# Patient Record
Sex: Female | Born: 1960 | Race: White | Hispanic: Yes | Marital: Married | Smoking: Former smoker
Health system: Southern US, Community
[De-identification: ages and names within clinical notes are randomized; demographics above are authoritative.]

## PROBLEM LIST (undated history)

## (undated) DIAGNOSIS — I38 Endocarditis, valve unspecified: Secondary | ICD-10-CM

## (undated) DIAGNOSIS — E039 Hypothyroidism, unspecified: Secondary | ICD-10-CM

## (undated) DIAGNOSIS — R011 Cardiac murmur, unspecified: Secondary | ICD-10-CM

## (undated) DIAGNOSIS — T8859XA Other complications of anesthesia, initial encounter: Secondary | ICD-10-CM

## (undated) DIAGNOSIS — F32A Depression, unspecified: Secondary | ICD-10-CM

## (undated) DIAGNOSIS — E119 Type 2 diabetes mellitus without complications: Secondary | ICD-10-CM

## (undated) DIAGNOSIS — R059 Cough, unspecified: Secondary | ICD-10-CM

## (undated) DIAGNOSIS — R0600 Dyspnea, unspecified: Secondary | ICD-10-CM

## (undated) DIAGNOSIS — R112 Nausea with vomiting, unspecified: Secondary | ICD-10-CM

## (undated) DIAGNOSIS — I10 Essential (primary) hypertension: Secondary | ICD-10-CM

## (undated) DIAGNOSIS — G629 Polyneuropathy, unspecified: Secondary | ICD-10-CM

## (undated) DIAGNOSIS — N289 Disorder of kidney and ureter, unspecified: Secondary | ICD-10-CM

## (undated) DIAGNOSIS — R05 Cough: Secondary | ICD-10-CM

## (undated) DIAGNOSIS — F329 Major depressive disorder, single episode, unspecified: Secondary | ICD-10-CM

## (undated) DIAGNOSIS — F419 Anxiety disorder, unspecified: Secondary | ICD-10-CM

## (undated) DIAGNOSIS — Z9889 Other specified postprocedural states: Secondary | ICD-10-CM

## (undated) DIAGNOSIS — T4145XA Adverse effect of unspecified anesthetic, initial encounter: Secondary | ICD-10-CM

## (undated) HISTORY — PX: AMPUTATION TOE: SHX6595

## (undated) HISTORY — PX: DILATION AND CURETTAGE OF UTERUS: SHX78

---

## 2016-11-21 ENCOUNTER — Ambulatory Visit
Admission: EM | Admit: 2016-11-21 | Discharge: 2016-11-21 | Disposition: A | Discharge status: Home / Self Care | Payer: Medicare Other | Attending: Emergency Medicine | Admitting: Emergency Medicine

## 2016-11-21 ENCOUNTER — Encounter: Payer: Self-pay | Admitting: *Deleted

## 2016-11-21 DIAGNOSIS — G44209 Tension-type headache, unspecified, not intractable: Secondary | ICD-10-CM

## 2016-11-21 DIAGNOSIS — Z760 Encounter for issue of repeat prescription: Secondary | ICD-10-CM

## 2016-11-21 HISTORY — DX: Type 2 diabetes mellitus without complications: E11.9

## 2016-11-21 HISTORY — DX: Anxiety disorder, unspecified: F41.9

## 2016-11-21 HISTORY — DX: Depression, unspecified: F32.A

## 2016-11-21 HISTORY — DX: Essential (primary) hypertension: I10

## 2016-11-21 HISTORY — DX: Disorder of kidney and ureter, unspecified: N28.9

## 2016-11-21 HISTORY — DX: Major depressive disorder, single episode, unspecified: F32.9

## 2016-11-21 MED ORDER — INSULIN LISPRO 100 UNIT/ML ~~LOC~~ SOLN: 12.0000 [IU] | Freq: Three times a day (TID) | SUBCUTANEOUS | 0 refills | Status: DC

## 2016-11-21 MED ORDER — CLONAZEPAM 0.5 MG PO TABS: 0.5000 mg | ORAL_TABLET | Freq: Every day | ORAL | 0 refills | Status: AC

## 2016-11-21 MED ORDER — CYCLOBENZAPRINE HCL 5 MG PO TABS: 5.0000 mg | ORAL_TABLET | Freq: Three times a day (TID) | ORAL | 0 refills | Status: DC | PRN

## 2016-11-21 NOTE — Discharge Instructions (Signed)
You may take 2 tabs of Flexeril up to 3 times a day. Start with 1 tablet, which is 5 mg 3 times a day. If that does not work, increase the dose. Take 1 g of Tylenol 3-4 times a day as needed for headache. Try some deep tissue massage. You may also try the Voltaren gel.

## 2016-11-21 NOTE — ED Triage Notes (Signed)
Patient started having headache and neck pain yesterday. Patient has an extensive medical history.

## 2016-11-21 NOTE — ED Provider Notes (Addendum)
HPI  SUBJECTIVE:  Denise Simpson is a 56 y.o. female who reports  bilateral neck and shoulder pain described as constant, soreness, tightness and now a occipital headache radiating to the front starting yesterday. She states that she is under significantly increased amount of stress. She states the headache was a gradual onset. She does describe mild nausea, no vomiting. She reports sinus pressure for the past month, nasal congestion, clear rhinorrhea and postnasal drip. No sleeping on her Name. No trauma to her neck, shoulders. No vomiting, fevers, photophobia, photophobia. No body aches, rash, neck stiffness. The dysarthria, aphasia, relate weakness, discoordination, facial droop. This is not the worst headache of her life. No ear pain, dental pain. No syncope, seizures. No eye strain. No visual changes. She has tried Tylenol with some improvement in her symptoms. Symptoms are worse with moving her neck. Her headache is not associated with bending forward or lying down. She has a past medical history of diabetes. States that her glucose has been running in the 200s. Normal is 180 for her. States that she is running low on her Humalog. Also PMH of hypertension, chronic kidney disease stage III, anxiety. States that she ran out of Klonopin 10 days ago. She has a history of migraines and sinusitis. No history of HIV. PMD: Dr. Brett Fairy in [place].. She is supposed to be establishing care with Dr. Maryjane Hurter but is awaiting records from [place].   Past Medical History:  Diagnosis Date  . Anxiety and depression   . Diabetes mellitus without complication (HCC)   . Hypertension   . Renal disorder     Past Surgical History:  Procedure Laterality Date  . AMPUTATION TOE Left     Family History  Problem Relation Age of Onset  . Cancer Mother   . Diabetes Father   . Hypertension Father   . Diabetes Sister   . Hypertension Sister   . Diabetes Brother   . Hypertension Brother      Social History  Substance Use Topics  . Smoking status: Never Smoker  . Smokeless tobacco: Never Used  . Alcohol use Yes    No current facility-administered medications for this encounter.   Current Outpatient Prescriptions:  .  aspirin EC 81 MG tablet, Take 81 mg by mouth daily., Disp: , Rfl:  .  atorvastatin (LIPITOR) 10 MG tablet, Take 10 mg by mouth daily at 6 PM., Disp: , Rfl:  .  Cholecalciferol (D3 HIGH POTENCY) 2000 units CAPS, Take by mouth daily., Disp: , Rfl:  .  escitalopram (LEXAPRO) 10 MG tablet, Take 10 mg by mouth daily., Disp: , Rfl:  .  fenofibrate 160 MG tablet, Take 160 mg by mouth daily., Disp: , Rfl:  .  Insulin Glargine (TOUJEO MAX SOLOSTAR) 300 UNIT/ML SOPN, Inject 36 Units into the skin daily., Disp: , Rfl:  .  levothyroxine (SYNTHROID, LEVOTHROID) 25 MCG tablet, Take 50 mcg by mouth daily before breakfast., Disp: , Rfl:  .  losartan (COZAAR) 25 MG tablet, Take 25 mg by mouth daily., Disp: , Rfl:  .  clonazePAM (KLONOPIN) 0.5 MG tablet, Take 1 tablet (0.5 mg total) by mouth at bedtime., Disp: 10 tablet, Rfl: 0 .  cyclobenzaprine (FLEXERIL) 5 MG tablet, Take 1 tablet (5 mg total) by mouth 3 (three) times daily as needed for muscle spasms., Disp: 30 tablet, Rfl: 0 .  insulin lispro (HUMALOG) 100 UNIT/ML injection, Inject 0.12 mLs (12 Units total) into the skin 3 (three) times daily before meals.,  Disp: 10 mL, Rfl: 0  No Known Allergies   ROS  As noted in HPI.   Physical Exam  BP (!) 147/82 (BP Location: Left Arm)   Pulse 81   Temp 97.8 F (36.6 C) (Oral)   Resp 16   Ht 5\' 3"  (1.6 m)   Wt 200 lb (90.7 kg)   LMP 10/31/2016   SpO2 100%   BMI 35.43 kg/m    BP Readings from Last 3 Encounters:  11/21/16 (!) 147/82    Constitutional: Well developed, well nourished, no acute distress Eyes: PERRL, EOMI, conjunctiva normal bilaterally. Limited funduscopic bilaterally. HENT: Normocephalic, atraumatic,mucus membranes moist, normal dentition.  TM  normal b/l. No TMJ tenderness. Normal dentition. No nasal congestion, no sinus tenderness. No temporal artery tenderness.  Neck: no cervical LN + bilateral trapezial muscle tenderness and tenderness at the insertion at the occiput.. No meningismus Respiratory: normal inspiratory effort Cardiovascular: Normal rate GI:  nondistended skin: No rash, skin intact Musculoskeletal: No edema, no tenderness, no deformities Neurologic: Alert & oriented x 3, CN II-XII intact, romberg neg, finger-> nose, heel-> shin equal b/l, Romberg neg,. Unable to perform tandem gait due to toe amputation. Psychiatric: Speech and behavior appropriate   ED Course   Medications - No data to display  No orders of the defined types were placed in this encounter.  No results found for this or any previous visit (from the past 24 hour(s)). No results found.   ED Clinical Impression  Acute non intractable tension-type headache  Medication refill  ED Assessment/Plan Care everywhere records reviewed. Additional medical history obtained as noted in history of present illness   last BUN/creatinine 07/2015 was 33/1.56. GFR 37. This appears to be baseline. Labs come back to 2016.   no sudden onset. Doubt SAH, ICH or space occupying lesion. Pt without fevers/chills, Pt has no meningeal sx, no nuchal rigidity. Doubt meningitis. Pt with normal neuro exam, no evidence of CVA/TIA.  Pt BP not elevated significantly, doubt hypertensive emergency. No evidence of temporal artery tenderness, no evidence of glaucoma or other ocular pathology.   Presentation most consistent with a musculoskeletal headache. Home with Tylenol thousand milligrams 4 times a day, increasing her to Flexeril 5 mg 3 times a day. She has Voltaren gel which she may try at home. We'll refill her Humalog 12 units 3 times a day and also give her a short course of Klonopin. The Klonopin may help with anxiety and muscular tension. She will follow-up with Dr.  Maryjane Hurter.  Discussed MDM, plan for follow up, signs and sx that should prompt return to ER. Pt agrees with plan  Meds ordered this encounter  Medications  . atorvastatin (LIPITOR) 10 MG tablet    Sig: Take 10 mg by mouth daily at 6 PM.  . fenofibrate 160 MG tablet    Sig: Take 160 mg by mouth daily.  Marland Kitchen losartan (COZAAR) 25 MG tablet    Sig: Take 25 mg by mouth daily.  Marland Kitchen levothyroxine (SYNTHROID, LEVOTHROID) 25 MCG tablet    Sig: Take 50 mcg by mouth daily before breakfast.  . Cholecalciferol (D3 HIGH POTENCY) 2000 units CAPS    Sig: Take by mouth daily.  Marland Kitchen DISCONTD: clonazePAM (KLONOPIN) 0.5 MG tablet    Sig: Take 0.5 mg by mouth at bedtime.  Marland Kitchen escitalopram (LEXAPRO) 10 MG tablet    Sig: Take 10 mg by mouth daily.  Marland Kitchen DISCONTD: cyclobenzaprine (FLEXERIL) 5 MG tablet    Sig: Take 5 mg by mouth  at bedtime.  Marland Kitchen. aspirin EC 81 MG tablet    Sig: Take 81 mg by mouth daily.  . Insulin Glargine (TOUJEO MAX SOLOSTAR) 300 UNIT/ML SOPN    Sig: Inject 36 Units into the skin daily.  Marland Kitchen. DISCONTD: insulin lispro (HUMALOG) 100 UNIT/ML injection    Sig: Inject 12 Units into the skin 3 (three) times daily before meals.  . clonazePAM (KLONOPIN) 0.5 MG tablet    Sig: Take 1 tablet (0.5 mg total) by mouth at bedtime.    Dispense:  10 tablet    Refill:  0  . cyclobenzaprine (FLEXERIL) 5 MG tablet    Sig: Take 1 tablet (5 mg total) by mouth 3 (three) times daily as needed for muscle spasms.    Dispense:  30 tablet    Refill:  0  . insulin lispro (HUMALOG) 100 UNIT/ML injection    Sig: Inject 0.12 mLs (12 Units total) into the skin 3 (three) times daily before meals.    Dispense:  10 mL    Refill:  0    *This clinic note was created using Scientist, clinical (histocompatibility and immunogenetics)Dragon dictation software. Therefore, there may be occasional mistakes despite careful proofreading.  ?   Domenick GongMortenson, Filimon Miranda, MD 11/21/16 2150    Domenick GongMortenson, Tamilyn Lupien, MD 11/21/16 2150

## 2017-01-05 ENCOUNTER — Encounter: Payer: Self-pay | Admitting: *Deleted

## 2017-01-05 ENCOUNTER — Ambulatory Visit
Admission: EM | Admit: 2017-01-05 | Discharge: 2017-01-05 | Disposition: A | Discharge status: Home / Self Care | Payer: Medicare Other | Attending: Family Medicine | Admitting: Family Medicine

## 2017-01-05 DIAGNOSIS — T148XXA Other injury of unspecified body region, initial encounter: Secondary | ICD-10-CM

## 2017-01-05 DIAGNOSIS — L03031 Cellulitis of right toe: Secondary | ICD-10-CM

## 2017-01-05 DIAGNOSIS — L089 Local infection of the skin and subcutaneous tissue, unspecified: Secondary | ICD-10-CM

## 2017-01-05 MED ORDER — LEVOFLOXACIN 500 MG PO TABS: 500.0000 mg | ORAL_TABLET | Freq: Every day | ORAL | 0 refills | Status: DC

## 2017-01-05 MED ORDER — INSULIN LISPRO 100 UNIT/ML ~~LOC~~ SOLN: 12.0000 [IU] | Freq: Three times a day (TID) | SUBCUTANEOUS | 0 refills | Status: AC

## 2017-01-05 MED ORDER — MUPIROCIN 2 % EX OINT: 1.0000 "application " | TOPICAL_OINTMENT | Freq: Two times a day (BID) | CUTANEOUS | 0 refills | Status: DC

## 2017-01-05 NOTE — ED Provider Notes (Signed)
[facility]    CSN: [id number] Arrival date & time: 01/05/17  1458     History   Chief Complaint Chief Complaint  Patient presents with  . Toe Injury    HPI Denise Simpson is a 56 y.o. female.   Patient is a 1 year old white female with multiple medical problems such as type 1 diabetes hypertension renal disease anxiety and depression. She's here for several problems (1) she needs refill of her Humalog insulin she has appointment with her PCP next week and will be seeing the nurse practitioner of the practice [facility].  The main problem though is on her right toe she was removing what she thought was a corn or callus on her big right toe she states she used a finishing stone and thought that she would just be removing the corn and instead she stopped ulceration instead. This happened yesterday when she removed the corn. She states she's been using Bactroban ointment but is an old prescription that she's put that on it. Her concern is that the left toe had a ulceration that was treated sounds as if it was an eschar when the eschar was peeled back by her the toe got infected and she wanted losing her left big toe. She is terrified that she may lose her right big toe now. Would request please for an antibiotic to prevent that. She has no idea course for her A1c is since she's not seen a doctor for several months she's been here for 3 months has been waiting to try to get into see a PCP.   He does not smoke no known drug allergies his history of cancer in the family. And diabetes in the family.   The history is provided by the patient. No language interpreter was used.    Past Medical History:  Diagnosis Date  . Anxiety and depression   . Diabetes mellitus without complication (HCC)   . Hypertension   . Renal disorder     There are no active problems to display for this patient.   Past Surgical History:  Procedure Laterality Date  .  AMPUTATION TOE Left     OB History    No data available       Home Medications    Prior to Admission medications   Medication Sig Start Date End Date Taking? Authorizing Provider  aspirin EC 81 MG tablet Take 81 mg by mouth daily.   Yes [provider]  atorvastatin (LIPITOR) 10 MG tablet Take 10 mg by mouth daily at 6 PM.   Yes [provider]  Cholecalciferol (D3 HIGH POTENCY) 2000 units CAPS Take by mouth daily.   Yes [provider]  cyclobenzaprine (FLEXERIL) 5 MG tablet Take 1 tablet (5 mg total) by mouth 3 (three) times daily as needed for muscle spasms. 11/21/16  Yes Domenick Gong, MD  escitalopram (LEXAPRO) 10 MG tablet Take 10 mg by mouth daily.   Yes [provider]  fenofibrate 160 MG tablet Take 160 mg by mouth daily.   Yes [provider]  Insulin Glargine (TOUJEO MAX SOLOSTAR) 300 UNIT/ML SOPN Inject 36 Units into the skin daily.   Yes [provider]  levothyroxine (SYNTHROID, LEVOTHROID) 25 MCG tablet Take 50 mcg by mouth daily before breakfast.   Yes [provider]  losartan (COZAAR) 25 MG tablet Take 25 mg by mouth daily.   Yes [provider]  clonazePAM (KLONOPIN) 0.5 MG  tablet Take 1 tablet (0.5 mg total) by mouth at bedtime. 11/21/16   Domenick Gong, MD  insulin lispro (HUMALOG) 100 UNIT/ML injection Inject 0.12 mLs (12 Units total) into the skin 3 (three) times daily before meals. 01/05/17   Hassan Rowan, MD  levofloxacin (LEVAQUIN) 500 MG tablet Take 1 tablet (500 mg total) by mouth daily. 01/05/17   Hassan Rowan, MD  mupirocin ointment (BACTROBAN) 2 % Apply 1 application topically 2 (two) times daily. 01/05/17   Hassan Rowan, MD    Family History Family History  Problem Relation Age of Onset  . Cancer Mother   . Diabetes Father   . Hypertension Father   . Diabetes Sister   . Hypertension Sister   . Diabetes Brother   . Hypertension Brother     Social History Social History    Substance Use Topics  . Smoking status: Never Smoker  . Smokeless tobacco: Never Used  . Alcohol use Yes     Allergies   Patient has no known allergies.   Review of Systems Review of Systems  All other systems reviewed and are negative.    Physical Exam Triage Vital Signs ED Triage Vitals  Enc Vitals Group     BP 01/05/17 1539 139/81     Pulse Rate 01/05/17 1539 96     Resp 01/05/17 1539 16     Temp 01/05/17 1539 98.7 F (37.1 C)     Temp Source 01/05/17 1539 Oral     SpO2 01/05/17 1539 99 %     Weight --      Height --      Head Circumference --      Peak Flow --      Pain Score 01/05/17 1542 0     Pain Loc --      Pain Edu? --      Excl. in GC? --    No data found.   Updated Vital Signs BP 139/81 (BP Location: Left Arm)   Pulse 96   Temp 98.7 F (37.1 C) (Oral)   Resp 16   LMP 12/25/2016   SpO2 99%   Visual Acuity Right Eye Distance:   Left Eye Distance:   Bilateral Distance:    Right Eye Near:   Left Eye Near:    Bilateral Near:     Physical Exam  Constitutional: She is oriented to person, place, and time. She appears well-developed and well-nourished.  Non-toxic appearance. She does not have a sickly appearance. She does not appear ill. No distress.  Obese white female  HENT:  Head: Normocephalic and atraumatic.  Eyes: Pupils are equal, round, and reactive to light.  Neck: Normal range of motion.  Pulmonary/Chest: Effort normal.  Musculoskeletal: She exhibits tenderness and deformity.       Right foot: There is tenderness, swelling and laceration.       Feet:  Small ulceration on the right PICC toe on the lateral side it does not appear to be markedly infected or red but there is a slight redness to the center of it she's has the left big toe missing  Neurological: She is alert and oriented to person, place, and time.  Skin: No rash noted. No erythema.  Psychiatric: She has a normal mood and affect.  Vitals reviewed.    UC Treatments  / Results  Labs (all labs ordered are listed, but only abnormal results are displayed) Labs Reviewed - No data to display  EKG  EKG Interpretation None  Radiology No results found.  Procedures Procedures (including critical care time)  Medications Ordered in UC Medications - No data to display   Initial Impression / Assessment and Plan / UC Course  I have reviewed the triage vital signs and the nursing notes.  Pertinent labs & imaging results that were available during my care of the patient were reviewed by me and considered in my medical decision making (see chart for details).     The redness could just be from the corn being removed or could be an early infection we'll take a chance this lady will place on Levaquin 500 mg 1 tablet day for 7 days continue the Bactroban to 3 times a day given new prescription for that we'll renew her Humalog one bout as requested for her follow-up with her new PCP next week.  Final Clinical Impressions(s) / UC Diagnoses   Final diagnoses:  Cellulitis of toe of right foot  Post-traumatic wound infection    New Prescriptions Discharge Medication List as of 01/05/2017  5:00 PM    START taking these medications   Details  levofloxacin (LEVAQUIN) 500 MG tablet Take 1 tablet (500 mg total) by mouth daily., Starting Mon 01/05/2017, Normal    mupirocin ointment (BACTROBAN) 2 % Apply 1 application topically 2 (two) times daily., Starting Mon 01/05/2017, Normal        Note: This dictation was prepared with Dragon dictation along with smaller phrase technology. Any transcriptional errors that result from this process are unintentional.   Hassan RowanWade, Kayhan Boardley, MD 01/05/17 1719

## 2017-01-05 NOTE — ED Triage Notes (Signed)
Patient wash removing a callus from her left big causing a dark spot. 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

## 2018-01-21 ENCOUNTER — Other Ambulatory Visit: Payer: Self-pay | Admitting: Nurse Practitioner

## 2018-01-21 DIAGNOSIS — Z1231 Encounter for screening mammogram for malignant neoplasm of breast: Secondary | ICD-10-CM

## 2018-02-10 ENCOUNTER — Encounter (INDEPENDENT_AMBULATORY_CARE_PROVIDER_SITE_OTHER): Payer: Self-pay

## 2018-02-10 ENCOUNTER — Ambulatory Visit
Admission: RE | Admit: 2018-02-10 | Discharge: 2018-02-10 | Disposition: A | Discharge status: Home / Self Care | Payer: Medicare Other | Source: Ambulatory Visit | Attending: Nurse Practitioner | Admitting: Nurse Practitioner

## 2018-02-10 DIAGNOSIS — Z1231 Encounter for screening mammogram for malignant neoplasm of breast: Secondary | ICD-10-CM

## 2018-03-03 ENCOUNTER — Inpatient Hospital Stay
Admission: RE | Admit: 2018-03-03 | Discharge: 2018-03-03 | Disposition: A | Discharge status: Home / Self Care | Payer: Self-pay | Source: Ambulatory Visit | Attending: *Deleted | Admitting: *Deleted

## 2018-03-03 ENCOUNTER — Other Ambulatory Visit: Payer: Self-pay | Admitting: *Deleted

## 2018-03-03 DIAGNOSIS — Z9289 Personal history of other medical treatment: Secondary | ICD-10-CM

## 2018-07-19 ENCOUNTER — Encounter: Payer: Self-pay | Admitting: *Deleted

## 2018-07-28 ENCOUNTER — Ambulatory Visit: Payer: Medicare Other | Admitting: Anesthesiology

## 2018-07-28 ENCOUNTER — Ambulatory Visit
Admission: RE | Admit: 2018-07-28 | Discharge: 2018-07-28 | Disposition: A | Discharge status: Home / Self Care | Payer: Medicare Other | Attending: Ophthalmology | Admitting: Ophthalmology

## 2018-07-28 ENCOUNTER — Encounter
Admission: RE | Disposition: A | Discharge status: Home / Self Care | Payer: Self-pay | Source: Home / Self Care | Attending: Ophthalmology

## 2018-07-28 ENCOUNTER — Other Ambulatory Visit: Payer: Self-pay

## 2018-07-28 DIAGNOSIS — Z6832 Body mass index (BMI) 32.0-32.9, adult: Secondary | ICD-10-CM | POA: Diagnosis not present

## 2018-07-28 DIAGNOSIS — H2511 Age-related nuclear cataract, right eye: Secondary | ICD-10-CM | POA: Diagnosis present

## 2018-07-28 DIAGNOSIS — I10 Essential (primary) hypertension: Secondary | ICD-10-CM | POA: Insufficient documentation

## 2018-07-28 DIAGNOSIS — E669 Obesity, unspecified: Secondary | ICD-10-CM | POA: Diagnosis not present

## 2018-07-28 DIAGNOSIS — Z89429 Acquired absence of other toe(s), unspecified side: Secondary | ICD-10-CM | POA: Diagnosis not present

## 2018-07-28 DIAGNOSIS — F419 Anxiety disorder, unspecified: Secondary | ICD-10-CM | POA: Insufficient documentation

## 2018-07-28 DIAGNOSIS — E1136 Type 2 diabetes mellitus with diabetic cataract: Secondary | ICD-10-CM | POA: Diagnosis not present

## 2018-07-28 DIAGNOSIS — R011 Cardiac murmur, unspecified: Secondary | ICD-10-CM | POA: Diagnosis not present

## 2018-07-28 DIAGNOSIS — E7800 Pure hypercholesterolemia, unspecified: Secondary | ICD-10-CM | POA: Diagnosis not present

## 2018-07-28 DIAGNOSIS — Z91041 Radiographic dye allergy status: Secondary | ICD-10-CM | POA: Insufficient documentation

## 2018-07-28 DIAGNOSIS — E079 Disorder of thyroid, unspecified: Secondary | ICD-10-CM | POA: Diagnosis not present

## 2018-07-28 DIAGNOSIS — R05 Cough: Secondary | ICD-10-CM | POA: Diagnosis not present

## 2018-07-28 DIAGNOSIS — F329 Major depressive disorder, single episode, unspecified: Secondary | ICD-10-CM | POA: Diagnosis not present

## 2018-07-28 DIAGNOSIS — N289 Disorder of kidney and ureter, unspecified: Secondary | ICD-10-CM | POA: Insufficient documentation

## 2018-07-28 HISTORY — DX: Dyspnea, unspecified: R06.00

## 2018-07-28 HISTORY — DX: Other specified postprocedural states: R11.2

## 2018-07-28 HISTORY — DX: Hypothyroidism, unspecified: E03.9

## 2018-07-28 HISTORY — DX: Other complications of anesthesia, initial encounter: T88.59XA

## 2018-07-28 HISTORY — DX: Cardiac murmur, unspecified: R01.1

## 2018-07-28 HISTORY — PX: CATARACT EXTRACTION W/PHACO: SHX586

## 2018-07-28 HISTORY — DX: Nausea with vomiting, unspecified: R11.2

## 2018-07-28 HISTORY — DX: Cough, unspecified: R05.9

## 2018-07-28 HISTORY — DX: Other specified postprocedural states: Z98.890

## 2018-07-28 HISTORY — DX: Cough: R05

## 2018-07-28 HISTORY — DX: Adverse effect of unspecified anesthetic, initial encounter: T41.45XA

## 2018-07-28 LAB — GLUCOSE, CAPILLARY: Glucose-Capillary: 79 mg/dL (ref 70–99)

## 2018-07-28 SURGERY — PHACOEMULSIFICATION, CATARACT, WITH IOL INSERTION
Anesthesia: Monitor Anesthesia Care | Site: Eye | Laterality: Right

## 2018-07-28 MED ORDER — MIDAZOLAM HCL 2 MG/2ML IJ SOLN
INTRAMUSCULAR | Status: AC
  Filled 2018-07-28: qty 2

## 2018-07-28 MED ORDER — SODIUM HYALURONATE 23 MG/ML IO SOLN
INTRAOCULAR | Status: DC | PRN
  Administered 2018-07-28: 0.6 mL via INTRAOCULAR

## 2018-07-28 MED ORDER — POVIDONE-IODINE 5 % OP SOLN
OPHTHALMIC | Status: DC | PRN
  Administered 2018-07-28: 1 via OPHTHALMIC

## 2018-07-28 MED ORDER — SODIUM HYALURONATE 10 MG/ML IO SOLN
INTRAOCULAR | Status: DC | PRN
  Administered 2018-07-28: 0.55 mL via INTRAOCULAR

## 2018-07-28 MED ORDER — DEXAMETHASONE SODIUM PHOSPHATE 10 MG/ML IJ SOLN
INTRAMUSCULAR | Status: DC | PRN
  Administered 2018-07-28: 8 mg via INTRAVENOUS

## 2018-07-28 MED ORDER — MOXIFLOXACIN HCL 0.5 % OP SOLN
OPHTHALMIC | Status: DC | PRN
  Administered 2018-07-28: 0.2 mL via OPHTHALMIC

## 2018-07-28 MED ORDER — EPINEPHRINE PF 1 MG/ML IJ SOLN
INTRAOCULAR | Status: DC | PRN
  Administered 2018-07-28: 200 mL via OPHTHALMIC

## 2018-07-28 MED ORDER — LIDOCAINE HCL (PF) 4 % IJ SOLN
INTRAOCULAR | Status: DC | PRN
  Administered 2018-07-28: 4 mL via OPHTHALMIC

## 2018-07-28 MED ORDER — FENTANYL CITRATE (PF) 100 MCG/2ML IJ SOLN
INTRAMUSCULAR | Status: AC
  Filled 2018-07-28: qty 2

## 2018-07-28 MED ORDER — MIDAZOLAM HCL 2 MG/2ML IJ SOLN
INTRAMUSCULAR | Status: DC | PRN
  Administered 2018-07-28: 1 mg via INTRAVENOUS

## 2018-07-28 MED ORDER — SODIUM CHLORIDE 0.9 % IV SOLN
INTRAVENOUS | Status: DC
  Administered 2018-07-28 (×2): via INTRAVENOUS

## 2018-07-28 MED ORDER — FENTANYL CITRATE (PF) 100 MCG/2ML IJ SOLN
INTRAMUSCULAR | Status: DC | PRN
  Administered 2018-07-28: 50 ug via INTRAVENOUS

## 2018-07-28 MED ORDER — TETRACAINE HCL 0.5 % OP SOLN
1.0000 [drp] | OPHTHALMIC | Status: AC | PRN
  Administered 2018-07-28 (×3): 1 [drp] via OPHTHALMIC

## 2018-07-28 MED ORDER — ARMC OPHTHALMIC DILATING DROPS
1.0000 "application " | OPHTHALMIC | Status: AC
  Administered 2018-07-28 (×3): 1 via OPHTHALMIC

## 2018-07-28 MED ORDER — MOXIFLOXACIN HCL 0.5 % OP SOLN: 1.0000 [drp] | OPHTHALMIC | Status: DC | PRN

## 2018-07-28 SURGICAL SUPPLY — 16 items
DISSECTOR HYDRO NUCLEUS 50X22 (MISCELLANEOUS) ×2 IMPLANT
GLOVE BIO SURGEON STRL SZ8 (GLOVE) ×2 IMPLANT
GLOVE BIOGEL M 6.5 STRL (GLOVE) ×2 IMPLANT
GLOVE SURG LX 7.5 STRW (GLOVE) ×1
GLOVE SURG LX STRL 7.5 STRW (GLOVE) ×1 IMPLANT
GOWN STRL REUS W/ TWL LRG LVL3 (GOWN DISPOSABLE) ×2 IMPLANT
GOWN STRL REUS W/TWL LRG LVL3 (GOWN DISPOSABLE) ×2
LABEL CATARACT MEDS ST (LABEL) ×2 IMPLANT
LENS IOL TECNIS ITEC 18.5 (Intraocular Lens) ×2 IMPLANT
PACK CATARACT (MISCELLANEOUS) ×2 IMPLANT
PACK CATARACT KING (MISCELLANEOUS) ×2 IMPLANT
PACK EYE AFTER SURG (MISCELLANEOUS) ×2 IMPLANT
SOL BSS BAG (MISCELLANEOUS) ×2
SOLUTION BSS BAG (MISCELLANEOUS) ×1 IMPLANT
WATER STERILE IRR 250ML POUR (IV SOLUTION) ×2 IMPLANT
WIPE NON LINTING 3.25X3.25 (MISCELLANEOUS) ×2 IMPLANT

## 2018-07-28 NOTE — Anesthesia Preprocedure Evaluation (Signed)
Anesthesia Evaluation  Patient identified by MRN, date of birth, ID band Patient awake    Reviewed: Allergy & Precautions, NPO status , Patient's Chart, lab work & pertinent test results  History of Anesthesia Complications (+) PONV and history of anesthetic complications  Airway Mallampati: II  TM Distance: >3 FB Neck ROM: Full    Dental  (+) Upper Dentures, Lower Dentures   Pulmonary neg sleep apnea, neg COPD,    breath sounds clear to auscultation- rhonchi (-) wheezing      Cardiovascular hypertension, Pt. on medications (-) CAD, (-) Past MI, (-) Cardiac Stents and (-) CABG  Rhythm:Regular Rate:Normal - Systolic murmurs and - Diastolic murmurs    Neuro/Psych neg Seizures PSYCHIATRIC DISORDERS Anxiety Depression negative neurological ROS     GI/Hepatic negative GI ROS, Neg liver ROS,   Endo/Other  diabetes, Insulin DependentHypothyroidism   Renal/GU Renal InsufficiencyRenal disease     Musculoskeletal negative musculoskeletal ROS (+)   Abdominal (+) + obese,   Peds  Hematology negative hematology ROS (+)   Anesthesia Other Findings Past Medical History: No date: Anxiety and depression No date: Complication of anesthesia No date: Cough     Comment:  CHRONIC AT NIGHT FROM PND No date: Diabetes mellitus without complication (HCC) No date: Dyspnea No date: Heart murmur No date: Hypertension No date: Hypothyroidism No date: PONV (postoperative nausea and vomiting) No date: Renal disorder   Reproductive/Obstetrics                             Anesthesia Physical Anesthesia Plan  ASA: III  Anesthesia Plan: MAC   Post-op Pain Management:    Induction: Intravenous  PONV Risk Score and Plan: 3 and Midazolam  Airway Management Planned: Natural Airway  Additional Equipment:   Intra-op Plan:   Post-operative Plan:   Informed Consent: I have reviewed the patients History and  Physical, chart, labs and discussed the procedure including the risks, benefits and alternatives for the proposed anesthesia with the patient or authorized representative who has indicated his/her understanding and acceptance.       Plan Discussed with: CRNA and Anesthesiologist  Anesthesia Plan Comments:         Anesthesia Quick Evaluation

## 2018-07-28 NOTE — Transfer of Care (Signed)
Immediate Anesthesia Transfer of Care Note  Patient: Denise Simpson  Procedure(s) Performed: CATARACT EXTRACTION PHACO AND INTRAOCULAR LENS PLACEMENT (IOC) RIGHT, DIABETIC (Right Eye)  Patient Location: PACU  Anesthesia Type:MAC  Level of Consciousness: awake, alert  and oriented  Airway & Oxygen Therapy: Patient Spontanous Breathing and Patient connected to nasal cannula oxygen  Post-op Assessment: Report given to RN and Post -op Vital signs reviewed and stable  Post vital signs: Reviewed and stable  Last Vitals:  Vitals Value Taken Time  BP    Temp    Pulse    Resp    SpO2      Last Pain:  Vitals:   07/28/18 1147  TempSrc: Temporal  PainSc: 3          Complications: No apparent anesthesia complications

## 2018-07-28 NOTE — H&P (Signed)

## 2018-07-28 NOTE — Op Note (Signed)
OPERATIVE NOTE  Denise Simpson [id number] 07/28/2018   PREOPERATIVE DIAGNOSIS:  Nuclear sclerotic cataract right eye.  H25.11   POSTOPERATIVE DIAGNOSIS:    Nuclear sclerotic cataract right eye.     PROCEDURE:  Phacoemusification with posterior chamber intraocular lens placement of the right eye   LENS:   Implant Name Type Inv. Item Serial No. Manufacturer Lot No. LRB No. Used  LENS IOL DIOP 18.5 - [id number] Intraocular Lens LENS IOL DIOP 18.5 [id number] AMO  Right 1       PCB00 +18.5   ULTRASOUND TIME: 0 minutes 25 seconds.  CDE 1.79   SURGEON:  Willey Blade, MD, MPH  ANESTHESIOLOGIST: Anesthesiologist: Alver Fisher, MD CRNA: Henrietta Hoover, CRNA   ANESTHESIA:  Topical with tetracaine drops augmented with 1% preservative-free intracameral lidocaine.  ESTIMATED BLOOD LOSS: less than 1 mL.   COMPLICATIONS:  None.   DESCRIPTION OF PROCEDURE:  The patient was identified in the holding room and transported to the operating room and placed in the supine position under the operating microscope.  The right eye was identified as the operative eye and it was prepped and draped in the usual sterile ophthalmic fashion.   A 1.0 millimeter clear-corneal paracentesis was made at the 10:30 position. 0.5 ml of preservative-free 1% lidocaine with epinephrine was injected into the anterior chamber.  The anterior chamber was filled with Healon 5 viscoelastic.  A 2.4 millimeter keratome was used to make a near-clear corneal incision at the 8:00 position.  A curvilinear capsulorrhexis was made with a cystotome and capsulorrhexis forceps.  Balanced salt solution was used to hydrodissect and hydrodelineate the nucleus.   Phacoemulsification was then used in stop and chop fashion to remove the lens nucleus and epinucleus.  The remaining cortex was then removed using the irrigation and aspiration handpiece. Healon was then placed into the capsular bag to distend it for lens placement.  A lens was  then injected into the capsular bag.  The remaining viscoelastic was aspirated.   Wounds were hydrated with balanced salt solution.  The anterior chamber was inflated to a physiologic pressure with balanced salt solution.   Intracameral vigamox 0.1 mL undiluted was injected into the eye and a drop placed onto the ocular surface.  No wound leaks were noted.  The patient was taken to the recovery room in stable condition without complications of anesthesia or surgery  Willey Blade 07/28/2018, 12:03 PM

## 2018-07-28 NOTE — Anesthesia Post-op Follow-up Note (Signed)
Anesthesia QCDR form completed.        

## 2018-07-28 NOTE — Discharge Instructions (Signed)

## 2018-07-28 NOTE — Anesthesia Postprocedure Evaluation (Signed)
Anesthesia Post Note  Patient: Radiographer, therapeutic  Procedure(s) Performed: CATARACT EXTRACTION PHACO AND INTRAOCULAR LENS PLACEMENT (IOC) RIGHT, DIABETIC (Right Eye)  Patient location during evaluation: Short Stay Anesthesia Type: MAC Level of consciousness: awake and awake and alert Pain management: pain level controlled Vital Signs Assessment: post-procedure vital signs reviewed and stable Respiratory status: spontaneous breathing Cardiovascular status: blood pressure returned to baseline Postop Assessment: no headache and no backache Anesthetic complications: no     Last Vitals:  Vitals:   07/28/18 1147  BP: (!) 159/78  Pulse: 70  Resp: 18  Temp: (!) 36.2 C  SpO2: 100%    Last Pain:  Vitals:   07/28/18 1147  TempSrc: Temporal  PainSc: 3                  Buckner Malta

## 2018-08-23 ENCOUNTER — Encounter: Payer: Self-pay | Admitting: *Deleted

## 2018-08-25 ENCOUNTER — Ambulatory Visit
Admission: RE | Admit: 2018-08-25 | Discharge: 2018-08-25 | Disposition: A | Discharge status: Home / Self Care | Payer: Medicare Other | Attending: Ophthalmology | Admitting: Ophthalmology

## 2018-08-25 ENCOUNTER — Ambulatory Visit: Payer: Medicare Other | Admitting: Anesthesiology

## 2018-08-25 ENCOUNTER — Encounter: Payer: Self-pay | Admitting: Certified Registered Nurse Anesthetist

## 2018-08-25 ENCOUNTER — Encounter
Admission: RE | Disposition: A | Discharge status: Home / Self Care | Payer: Self-pay | Source: Home / Self Care | Attending: Ophthalmology

## 2018-08-25 ENCOUNTER — Other Ambulatory Visit: Payer: Self-pay

## 2018-08-25 DIAGNOSIS — N183 Chronic kidney disease, stage 3 (moderate): Secondary | ICD-10-CM | POA: Insufficient documentation

## 2018-08-25 DIAGNOSIS — E1136 Type 2 diabetes mellitus with diabetic cataract: Secondary | ICD-10-CM | POA: Diagnosis not present

## 2018-08-25 DIAGNOSIS — Z79890 Hormone replacement therapy (postmenopausal): Secondary | ICD-10-CM | POA: Insufficient documentation

## 2018-08-25 DIAGNOSIS — E1122 Type 2 diabetes mellitus with diabetic chronic kidney disease: Secondary | ICD-10-CM | POA: Insufficient documentation

## 2018-08-25 DIAGNOSIS — Z79899 Other long term (current) drug therapy: Secondary | ICD-10-CM | POA: Insufficient documentation

## 2018-08-25 DIAGNOSIS — Z794 Long term (current) use of insulin: Secondary | ICD-10-CM | POA: Insufficient documentation

## 2018-08-25 DIAGNOSIS — E039 Hypothyroidism, unspecified: Secondary | ICD-10-CM | POA: Insufficient documentation

## 2018-08-25 DIAGNOSIS — I129 Hypertensive chronic kidney disease with stage 1 through stage 4 chronic kidney disease, or unspecified chronic kidney disease: Secondary | ICD-10-CM | POA: Insufficient documentation

## 2018-08-25 DIAGNOSIS — H2512 Age-related nuclear cataract, left eye: Secondary | ICD-10-CM | POA: Insufficient documentation

## 2018-08-25 DIAGNOSIS — F418 Other specified anxiety disorders: Secondary | ICD-10-CM | POA: Insufficient documentation

## 2018-08-25 DIAGNOSIS — Z87891 Personal history of nicotine dependence: Secondary | ICD-10-CM | POA: Diagnosis not present

## 2018-08-25 DIAGNOSIS — I340 Nonrheumatic mitral (valve) insufficiency: Secondary | ICD-10-CM | POA: Insufficient documentation

## 2018-08-25 HISTORY — DX: Endocarditis, valve unspecified: I38

## 2018-08-25 HISTORY — PX: CATARACT EXTRACTION W/PHACO: SHX586

## 2018-08-25 HISTORY — DX: Polyneuropathy, unspecified: G62.9

## 2018-08-25 LAB — GLUCOSE, CAPILLARY: Glucose-Capillary: 111 mg/dL — ABNORMAL HIGH (ref 70–99)

## 2018-08-25 SURGERY — PHACOEMULSIFICATION, CATARACT, WITH IOL INSERTION
Anesthesia: Monitor Anesthesia Care | Site: Eye | Laterality: Left

## 2018-08-25 MED ORDER — CARBACHOL 0.01 % IO SOLN
INTRAOCULAR | Status: DC | PRN
  Administered 2018-08-25: 0.5 mL via INTRAOCULAR

## 2018-08-25 MED ORDER — FENTANYL CITRATE (PF) 100 MCG/2ML IJ SOLN
INTRAMUSCULAR | Status: DC | PRN
  Administered 2018-08-25: 50 ug via INTRAVENOUS

## 2018-08-25 MED ORDER — MOXIFLOXACIN HCL 0.5 % OP SOLN: 1.0000 [drp] | OPHTHALMIC | Status: DC | PRN

## 2018-08-25 MED ORDER — ARMC OPHTHALMIC DILATING DROPS
1.0000 "application " | OPHTHALMIC | Status: AC
  Administered 2018-08-25 (×3): 1 via OPHTHALMIC

## 2018-08-25 MED ORDER — MOXIFLOXACIN HCL 0.5 % OP SOLN
OPHTHALMIC | Status: AC
  Filled 2018-08-25: qty 3

## 2018-08-25 MED ORDER — SODIUM HYALURONATE 10 MG/ML IO SOLN
INTRAOCULAR | Status: DC | PRN
  Administered 2018-08-25: 0.55 mL via INTRAOCULAR

## 2018-08-25 MED ORDER — EPINEPHRINE PF 1 MG/ML IJ SOLN
INTRAOCULAR | Status: DC | PRN
  Administered 2018-08-25: 12:00:00 via OPHTHALMIC

## 2018-08-25 MED ORDER — TETRACAINE HCL 0.5 % OP SOLN
1.0000 [drp] | OPHTHALMIC | Status: DC | PRN
  Administered 2018-08-25: 1 [drp] via OPHTHALMIC

## 2018-08-25 MED ORDER — FENTANYL CITRATE (PF) 100 MCG/2ML IJ SOLN
INTRAMUSCULAR | Status: AC
  Filled 2018-08-25: qty 2

## 2018-08-25 MED ORDER — MOXIFLOXACIN HCL 0.5 % OP SOLN
OPHTHALMIC | Status: DC | PRN
  Administered 2018-08-25: 0.2 mL via OPHTHALMIC

## 2018-08-25 MED ORDER — LIDOCAINE HCL (PF) 4 % IJ SOLN
INTRAOCULAR | Status: DC | PRN
  Administered 2018-08-25: 4 mL via OPHTHALMIC

## 2018-08-25 MED ORDER — EPINEPHRINE PF 1 MG/ML IJ SOLN
INTRAMUSCULAR | Status: AC
  Filled 2018-08-25: qty 2

## 2018-08-25 MED ORDER — POVIDONE-IODINE 5 % OP SOLN
OPHTHALMIC | Status: DC | PRN
  Administered 2018-08-25: 1 via OPHTHALMIC

## 2018-08-25 MED ORDER — POVIDONE-IODINE 5 % OP SOLN
OPHTHALMIC | Status: AC
  Filled 2018-08-25: qty 30

## 2018-08-25 MED ORDER — MIDAZOLAM HCL 2 MG/2ML IJ SOLN
INTRAMUSCULAR | Status: DC | PRN
  Administered 2018-08-25 (×2): 0.5 mg via INTRAVENOUS
  Administered 2018-08-25: 1 mg via INTRAVENOUS

## 2018-08-25 MED ORDER — TETRACAINE HCL 0.5 % OP SOLN
OPHTHALMIC | Status: AC
  Administered 2018-08-25: 1 [drp] via OPHTHALMIC
  Filled 2018-08-25: qty 4

## 2018-08-25 MED ORDER — SODIUM HYALURONATE 23 MG/ML IO SOLN
INTRAOCULAR | Status: DC | PRN
  Administered 2018-08-25: 0.6 mL via INTRAOCULAR

## 2018-08-25 MED ORDER — MIDAZOLAM HCL 2 MG/2ML IJ SOLN
INTRAMUSCULAR | Status: AC
  Filled 2018-08-25: qty 2

## 2018-08-25 MED ORDER — ARMC OPHTHALMIC DILATING DROPS
OPHTHALMIC | Status: AC
  Administered 2018-08-25: 1 via OPHTHALMIC
  Filled 2018-08-25: qty 0.5

## 2018-08-25 MED ORDER — LIDOCAINE HCL (PF) 4 % IJ SOLN
INTRAMUSCULAR | Status: AC
  Filled 2018-08-25: qty 5

## 2018-08-25 MED ORDER — SODIUM HYALURONATE 23 MG/ML IO SOLN
INTRAOCULAR | Status: AC
  Filled 2018-08-25: qty 0.6

## 2018-08-25 MED ORDER — SODIUM CHLORIDE 0.9 % IV SOLN
INTRAVENOUS | Status: DC
  Administered 2018-08-25: 10:00:00 via INTRAVENOUS

## 2018-08-25 SURGICAL SUPPLY — 16 items

## 2018-08-25 NOTE — H&P (Signed)

## 2018-08-25 NOTE — Anesthesia Preprocedure Evaluation (Signed)
Anesthesia Evaluation  Patient identified by MRN, date of birth, ID band Patient awake    Reviewed: Allergy & Precautions, NPO status , Patient's Chart, lab work & pertinent test results  History of Anesthesia Complications (+) PONV and history of anesthetic complications  Airway Mallampati: II  TM Distance: >3 FB Neck ROM: Full    Dental  (+) Upper Dentures, Lower Dentures   Pulmonary neg sleep apnea, neg COPD, former smoker,    breath sounds clear to auscultation- rhonchi (-) wheezing      Cardiovascular hypertension, Pt. on medications (-) CAD, (-) Past MI, (-) Cardiac Stents and (-) CABG  Rhythm:Regular Rate:Normal - Systolic murmurs and - Diastolic murmurs    Neuro/Psych neg Seizures PSYCHIATRIC DISORDERS Anxiety Depression negative neurological ROS     GI/Hepatic negative GI ROS, Neg liver ROS,   Endo/Other  diabetes, Insulin DependentHypothyroidism   Renal/GU Renal InsufficiencyRenal disease     Musculoskeletal negative musculoskeletal ROS (+)   Abdominal (+) + obese,   Peds  Hematology negative hematology ROS (+)   Anesthesia Other Findings Past Medical History: No date: Anxiety and depression No date: Complication of anesthesia No date: Cough     Comment:  CHRONIC AT NIGHT FROM PND No date: Diabetes mellitus without complication (HCC) No date: Dyspnea No date: Heart murmur No date: Hypertension No date: Hypothyroidism No date: PONV (postoperative nausea and vomiting) No date: Renal disorder   Reproductive/Obstetrics                             Anesthesia Physical  Anesthesia Plan  ASA: III  Anesthesia Plan: MAC   Post-op Pain Management:    Induction: Intravenous  PONV Risk Score and Plan: 3 and Midazolam  Airway Management Planned: Natural Airway  Additional Equipment:   Intra-op Plan:   Post-operative Plan:   Informed Consent: I have reviewed the  patients History and Physical, chart, labs and discussed the procedure including the risks, benefits and alternatives for the proposed anesthesia with the patient or authorized representative who has indicated his/her understanding and acceptance.       Plan Discussed with: CRNA and Anesthesiologist  Anesthesia Plan Comments:         Anesthesia Quick Evaluation

## 2018-08-25 NOTE — Anesthesia Post-op Follow-up Note (Signed)
Anesthesia QCDR form completed.        

## 2018-08-25 NOTE — Transfer of Care (Signed)
Immediate Anesthesia Transfer of Care Note  Patient: Denise Simpson  Procedure(s) Performed: CATARACT EXTRACTION PHACO AND INTRAOCULAR LENS PLACEMENT (IOC) Left (Left Eye)  Patient Location: PACU  Anesthesia Type:MAC  Level of Consciousness: awake, alert  and oriented  Airway & Oxygen Therapy: Patient Spontanous Breathing  Post-op Assessment: Report given to RN and Post -op Vital signs reviewed and stable  Post vital signs: Reviewed and stable  Last Vitals:  Vitals Value Taken Time  BP    Temp    Pulse    Resp    SpO2      Last Pain:  Vitals:   08/25/18 1030  TempSrc: Oral  PainSc: 0-No pain         Complications: No apparent anesthesia complications

## 2018-08-25 NOTE — Anesthesia Procedure Notes (Signed)
Procedure Name: MAC Date/Time: 08/25/2018 12:24 PM Performed by: Johnna Acosta, CRNA Pre-anesthesia Checklist: Patient identified, Emergency Drugs available, Suction available, Patient being monitored and Timeout performed Patient Re-evaluated:Patient Re-evaluated prior to induction Oxygen Delivery Method: Nasal cannula Preoxygenation: Pre-oxygenation with 100% oxygen Induction Type: IV induction

## 2018-08-25 NOTE — Discharge Instructions (Signed)

## 2018-08-25 NOTE — Op Note (Signed)
OPERATIVE NOTE  Denise Simpson [id number] 08/25/2018   PREOPERATIVE DIAGNOSIS:  Nuclear sclerotic cataract left eye.  H25.12   POSTOPERATIVE DIAGNOSIS:    Nuclear sclerotic cataract left eye.     PROCEDURE:  Phacoemusification with posterior chamber intraocular lens placement of the left eye   LENS:   Implant Name Type Inv. Item Serial No. Manufacturer Lot No. LRB No. Used  LENS IOL DIOP 17.5 - [id number] Intraocular Lens LENS IOL DIOP 17.5 346 [id number] AMO  Left 1       PCB00 +17.5   ULTRASOUND TIME: 0 minutes 45 seconds.  CDE 3.14   SURGEON:  Willey Blade, MD, MPH   ANESTHESIA:  Topical with tetracaine drops augmented with 1% preservative-free intracameral lidocaine.  ESTIMATED BLOOD LOSS: <1 mL   COMPLICATIONS:  None.   DESCRIPTION OF PROCEDURE:  The patient was identified in the holding room and transported to the operating room and placed in the supine position under the operating microscope.  The left eye was identified as the operative eye and it was prepped and draped in the usual sterile ophthalmic fashion.   A 1.0 millimeter clear-corneal paracentesis was made at the 5:00 position. 0.5 ml of preservative-free 1% lidocaine with epinephrine was injected into the anterior chamber.  The anterior chamber was filled with Healon 5 viscoelastic.  A 2.4 millimeter keratome was used to make a near-clear corneal incision at the 2:00 position.  A curvilinear capsulorrhexis was made with a cystotome and capsulorrhexis forceps.  Balanced salt solution was used to hydrodissect and hydrodelineate the nucleus.   Phacoemulsification was then used in stop and chop fashion to remove the lens nucleus and epinucleus.  The remaining cortex was then removed using the irrigation and aspiration handpiece. Healon was then placed into the capsular bag to distend it for lens placement.  A lens was then injected into the capsular bag.  The remaining viscoelastic was aspirated.   Wounds were  hydrated with balanced salt solution.  The anterior chamber was inflated to a physiologic pressure with balanced salt solution.  Intracameral vigamox 0.1 mL undiltued was injected into the eye and a drop placed onto the ocular surface.  No wound leaks were noted.  The patient was taken to the recovery room in stable condition without complications of anesthesia or surgery  Willey Blade 08/25/2018, 12:41 PM

## 2018-08-25 NOTE — Anesthesia Postprocedure Evaluation (Signed)
Anesthesia Post Note  Patient: Radiographer, therapeutic  Procedure(s) Performed: CATARACT EXTRACTION PHACO AND INTRAOCULAR LENS PLACEMENT (IOC) Left (Left Eye)  Patient location during evaluation: PACU Anesthesia Type: MAC Level of consciousness: awake and alert Pain management: pain level controlled Vital Signs Assessment: post-procedure vital signs reviewed and stable Respiratory status: spontaneous breathing, nonlabored ventilation, respiratory function stable and patient connected to nasal cannula oxygen Cardiovascular status: stable and blood pressure returned to baseline Postop Assessment: no apparent nausea or vomiting Anesthetic complications: no     Last Vitals:  Vitals:   08/25/18 1243 08/25/18 1245  BP:  (!) 123/94  Pulse: 69   Resp: 18   Temp: 36.7 C   SpO2: 99%     Last Pain:  Vitals:   08/25/18 1243  TempSrc: Temporal  PainSc: 0-No pain                 Kalijah Westfall

## 2019-02-16 ENCOUNTER — Other Ambulatory Visit: Payer: Self-pay | Admitting: Nurse Practitioner

## 2019-02-16 DIAGNOSIS — Z1231 Encounter for screening mammogram for malignant neoplasm of breast: Secondary | ICD-10-CM

## 2019-03-16 ENCOUNTER — Other Ambulatory Visit: Payer: Self-pay | Admitting: Gastroenterology

## 2019-03-16 DIAGNOSIS — R1312 Dysphagia, oropharyngeal phase: Secondary | ICD-10-CM

## 2019-03-22 ENCOUNTER — Other Ambulatory Visit: Payer: Self-pay

## 2019-03-22 ENCOUNTER — Ambulatory Visit
Admission: RE | Admit: 2019-03-22 | Discharge: 2019-03-22 | Disposition: A | Discharge status: Home / Self Care | Payer: Medicare Other | Source: Ambulatory Visit | Attending: Gastroenterology | Admitting: Gastroenterology

## 2019-03-22 DIAGNOSIS — R1312 Dysphagia, oropharyngeal phase: Secondary | ICD-10-CM | POA: Diagnosis present

## 2019-11-22 ENCOUNTER — Other Ambulatory Visit: Payer: Self-pay | Admitting: Nurse Practitioner

## 2019-11-22 DIAGNOSIS — Z1231 Encounter for screening mammogram for malignant neoplasm of breast: Secondary | ICD-10-CM

## 2019-11-29 ENCOUNTER — Ambulatory Visit: Payer: Medicare Other

## 2020-09-03 IMAGING — RF DG ESOPHAGUS
12 of 20 series · 14 of 23 positions shown · non-contrast
Comparison: None.

CLINICAL DATA: Dysphagia

EXAM:
ESOPHOGRAM / BARIUM SWALLOW / BARIUM TABLET STUDY
TECHNIQUE: Combined double contrast and single contrast examination performed
using effervescent crystals, thick barium liquid, and thin barium
liquid. The patient was observed with fluoroscopy swallowing a 13 mm
barium sulphate tablet.
FLUOROSCOPY TIME:  Fluoroscopy Time:  1 minutes 30 seconds
Radiation Exposure Index (if provided by the fluoroscopic device):
34.3 mGy
Number of Acquired Spot Images: 14 full exposures

[Series 1: fluoro_barium 2fps_bw · 0.17mm/px · 1 of 1 slices shown (1 of 8)]
[im 1/1]
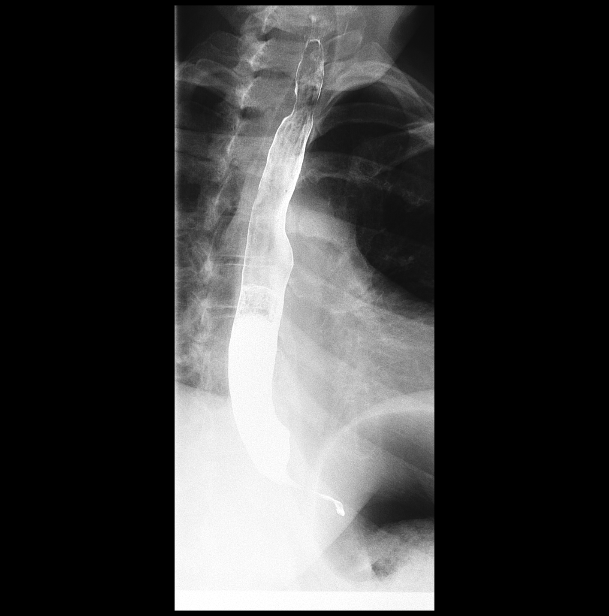

[Series 3: fluoro_barium 2fps_bw · 0.17mm/px · 1 of 1 slices shown (2 of 8)]
[im 1/1]
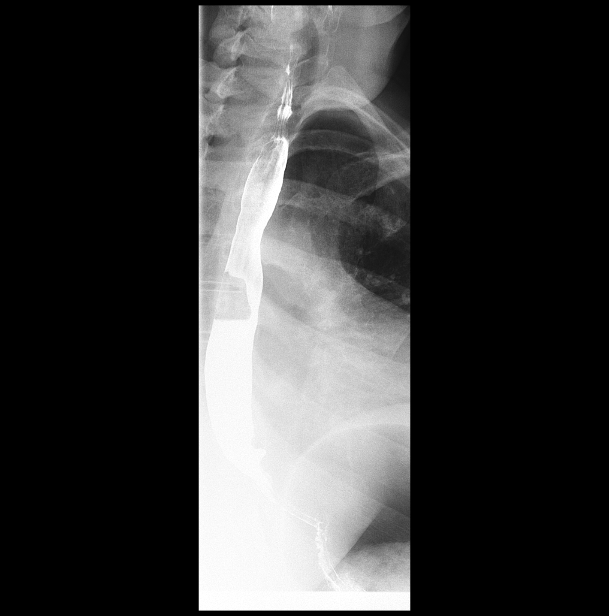

[Series 5: fluoro_barium 2fps_bw · 0.17mm/px · 1 of 1 slices shown (3 of 8)]
[im 1/1]
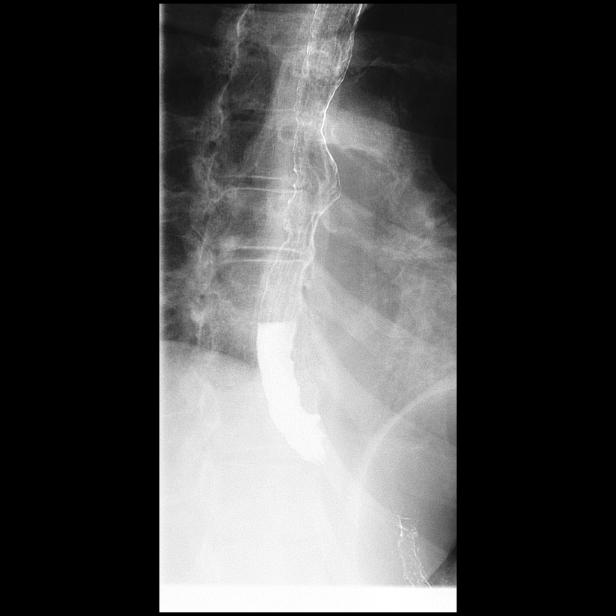

[Series 6: fluoro_barium 2fps_bw · 0.17mm/px · 1 of 1 slices shown (4 of 8)]
[im 1/1]
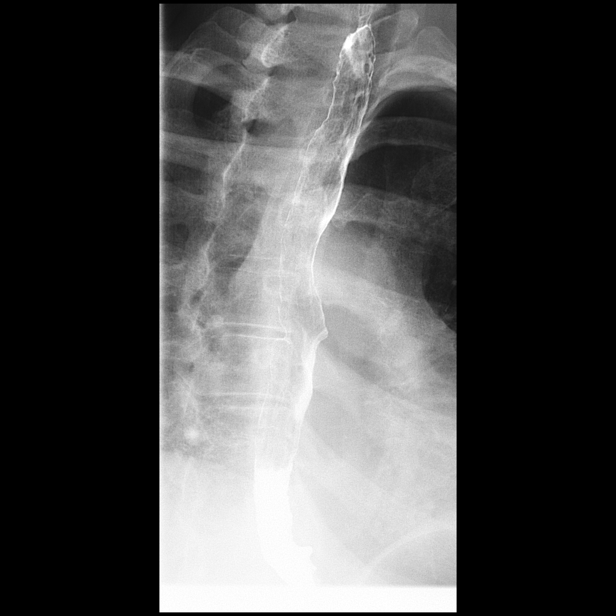

[Series 8: fluoro_barium 2fps_bw · 0.17mm/px · 1 of 1 slices shown (5 of 8)]
[im 1/1]
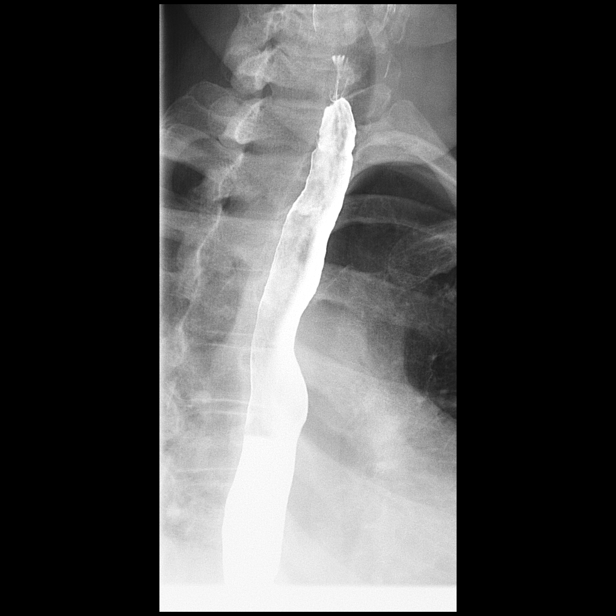

[Series 10: fluoro_barium 2fps_bw · 0.17mm/px · 1 of 1 slices shown (6 of 8)]
[im 1/1]
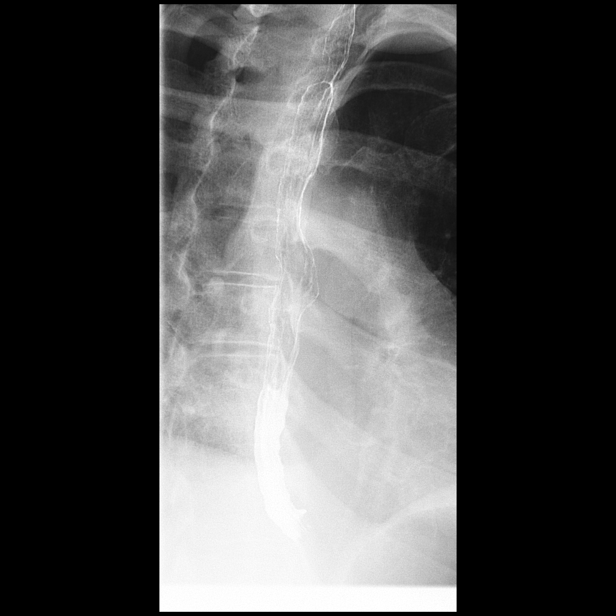

[Series 11: fluoro_barium 2fps_bw · 0.17mm/px · 1 of 1 slices shown (7 of 8)]
[im 1/1]
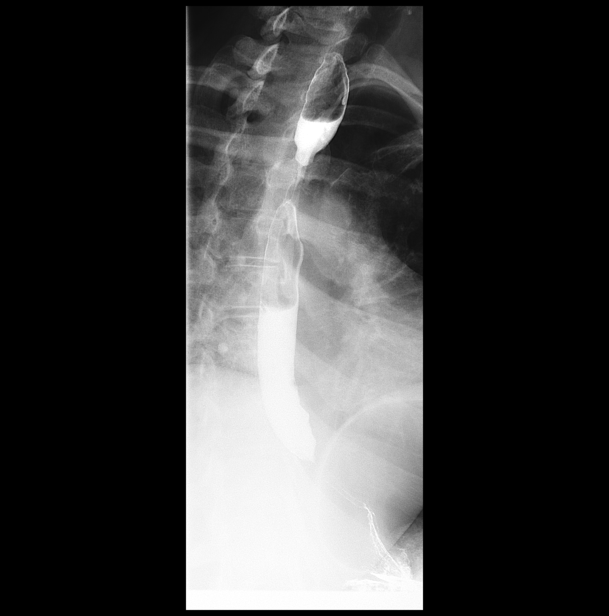

[Series 13: cp_standard · 0.26mm/px · 3 of 32 frames shown (1 of 4)]
[frame 5/32]
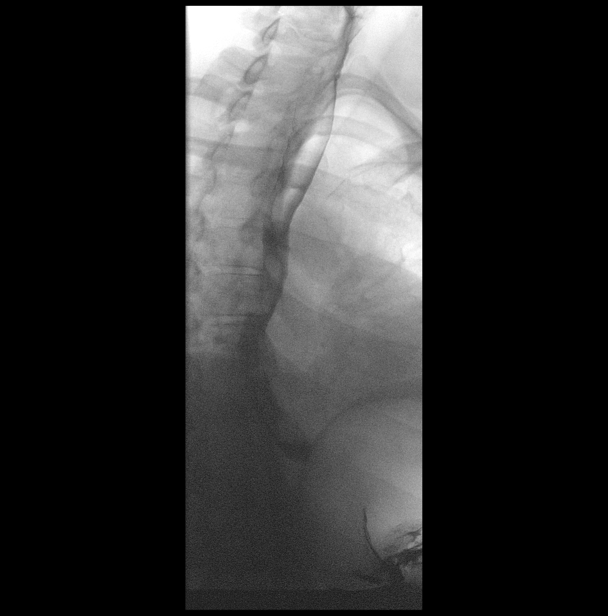
[frame 17/32]
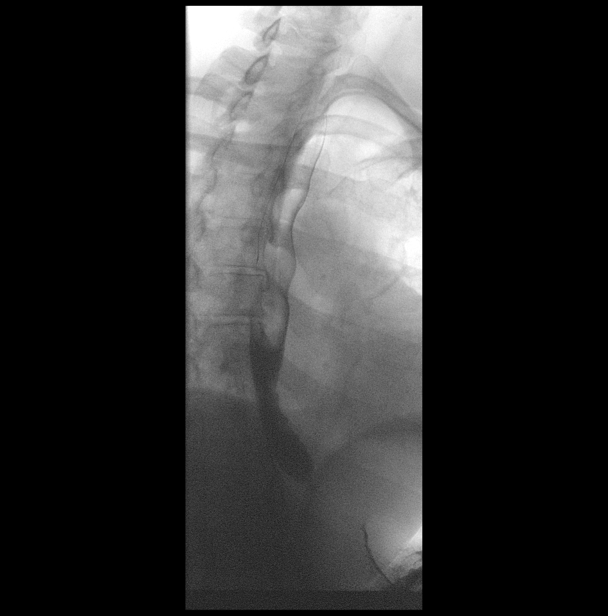
[frame 28/32]
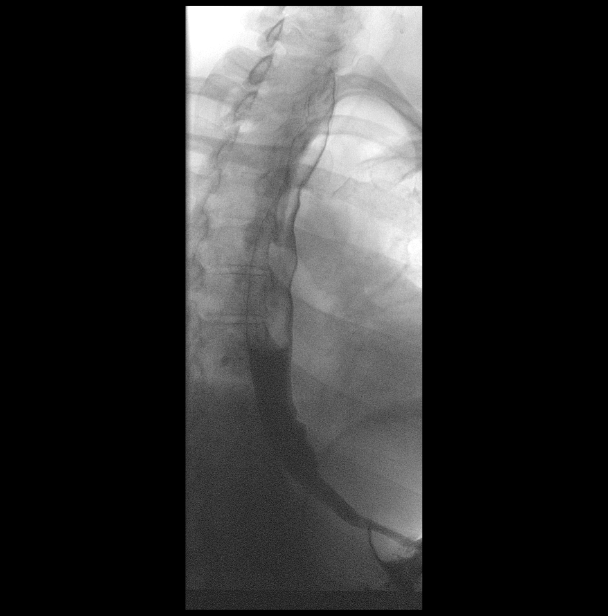

[Series 15: fluoro_barium 2fps_bw · 0.18mm/px · 1 of 1 slices shown (8 of 8)]
[im 1/1]
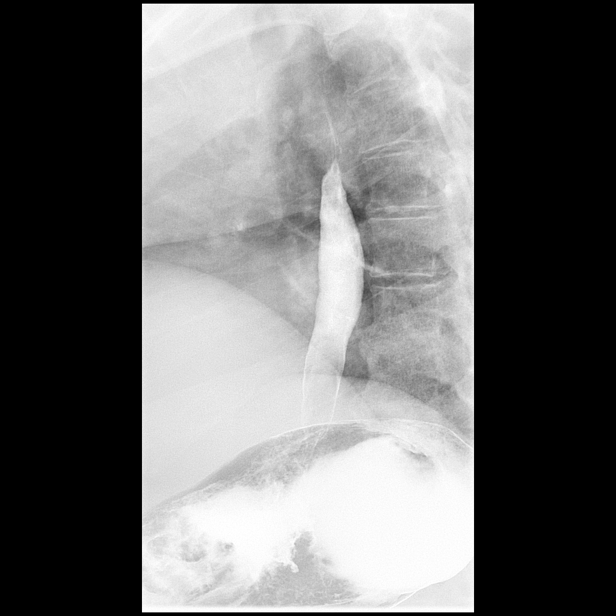

[Series 16: cp_standard · 0.18mm/px · 1 of 1 slices shown (2 of 4)]
[im 1/1]
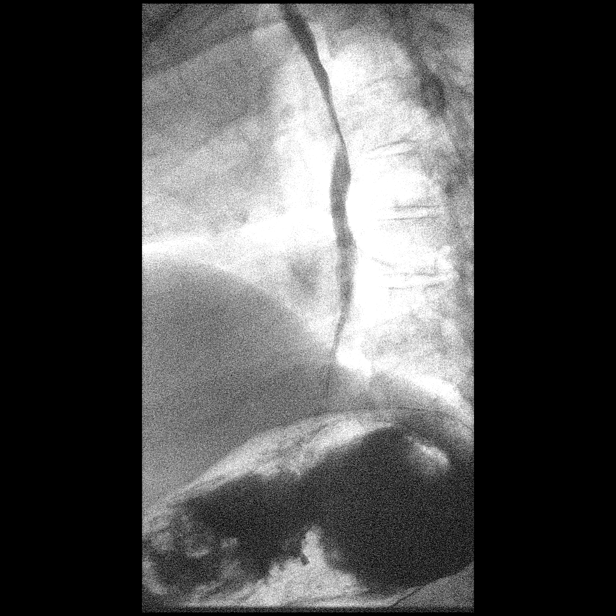

[Series 18: cp_standard · 0.18mm/px · 1 of 1 slices shown (3 of 4)]
[im 1/1]
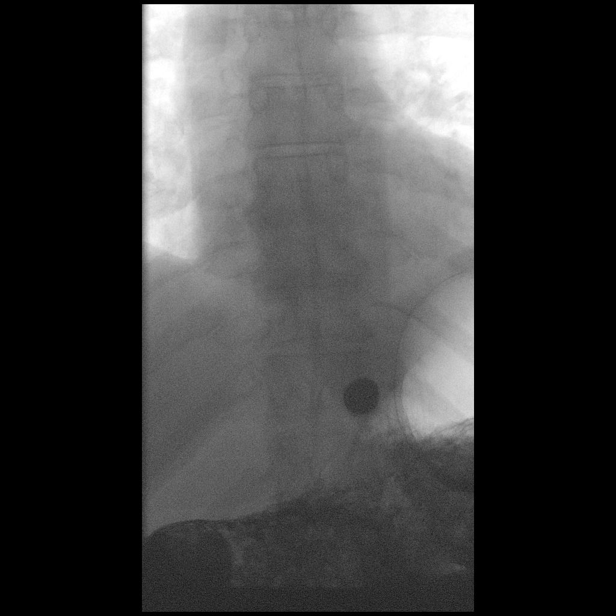

[Series 20: cp_standard · 0.18mm/px · 1 of 1 slices shown (4 of 4)]
[im 1/1]
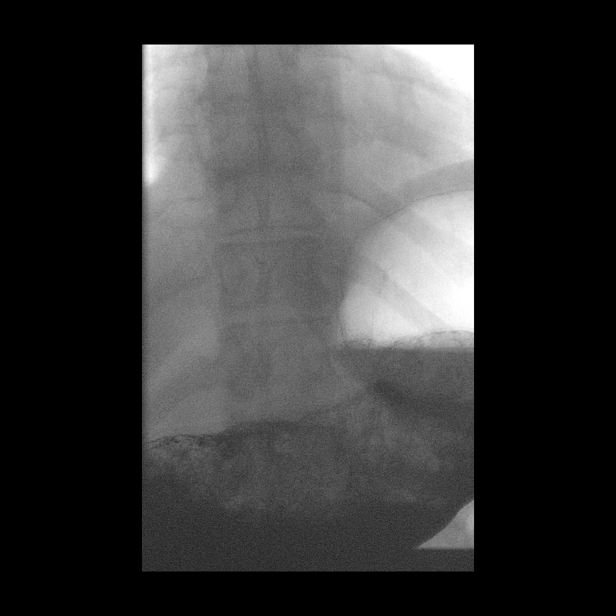

[14 of 23 positions shown; findings below may reference images not displayed]

FINDINGS: Decreased primary peristaltic wave with mild intermittent tertiary
contractions. Esophagus distends normally. No persisting stricture.
No discrete mass or ulceration. No spontaneous or elicited
gastroesophageal reflux. No hiatal hernia. At the conclusion of the
study, a 13 mm barium tablet was administered. This passed without
delay into the stomach.
IMPRESSION: Mild esophageal dysmotility.

## 2020-12-19 ENCOUNTER — Other Ambulatory Visit: Payer: Self-pay | Admitting: Neurology

## 2020-12-19 DIAGNOSIS — R4189 Other symptoms and signs involving cognitive functions and awareness: Secondary | ICD-10-CM

## 2020-12-26 ENCOUNTER — Other Ambulatory Visit: Payer: Self-pay

## 2020-12-26 ENCOUNTER — Ambulatory Visit
Admission: RE | Admit: 2020-12-26 | Discharge: 2020-12-26 | Disposition: A | Discharge status: Home / Self Care | Payer: Medicare Other | Source: Ambulatory Visit | Attending: Neurology | Admitting: Neurology

## 2020-12-26 DIAGNOSIS — R4189 Other symptoms and signs involving cognitive functions and awareness: Secondary | ICD-10-CM | POA: Insufficient documentation

## 2021-12-06 ENCOUNTER — Other Ambulatory Visit: Payer: Self-pay | Admitting: Nurse Practitioner

## 2021-12-06 DIAGNOSIS — Z1231 Encounter for screening mammogram for malignant neoplasm of breast: Secondary | ICD-10-CM

## 2023-09-17 ENCOUNTER — Other Ambulatory Visit: Payer: Self-pay

## 2023-09-17 ENCOUNTER — Emergency Department

## 2023-09-17 ENCOUNTER — Emergency Department
Admission: EM | Admit: 2023-09-17 | Discharge: 2023-09-17 | Disposition: A | Discharge status: Home / Self Care | Attending: Emergency Medicine | Admitting: Emergency Medicine

## 2023-09-17 ENCOUNTER — Encounter: Payer: Self-pay | Admitting: *Deleted

## 2023-09-17 DIAGNOSIS — R42 Dizziness and giddiness: Secondary | ICD-10-CM | POA: Diagnosis present

## 2023-09-17 DIAGNOSIS — E119 Type 2 diabetes mellitus without complications: Secondary | ICD-10-CM | POA: Diagnosis not present

## 2023-09-17 DIAGNOSIS — I10 Essential (primary) hypertension: Secondary | ICD-10-CM | POA: Diagnosis not present

## 2023-09-17 DIAGNOSIS — R112 Nausea with vomiting, unspecified: Secondary | ICD-10-CM | POA: Insufficient documentation

## 2023-09-17 LAB — COMPREHENSIVE METABOLIC PANEL WITH GFR
ALT: 18 U/L (ref 0–44)
AST: 31 U/L (ref 15–41)
Albumin: 4.5 g/dL (ref 3.5–5.0)
Alkaline Phosphatase: 43 U/L (ref 38–126)
Anion gap: 13 (ref 5–15)
BUN: 44 mg/dL — ABNORMAL HIGH (ref 8–23)
CO2: 23 mmol/L (ref 22–32)
Calcium: 9.6 mg/dL (ref 8.9–10.3)
Chloride: 101 mmol/L (ref 98–111)
Creatinine, Ser: 1.45 mg/dL — ABNORMAL HIGH (ref 0.44–1.00)
GFR, Estimated: 41 mL/min — ABNORMAL LOW (ref >60)
Glucose, Bld: 202 mg/dL — ABNORMAL HIGH (ref 70–99)
Potassium: 4.6 mmol/L (ref 3.5–5.1)
Sodium: 137 mmol/L (ref 135–145)
Total Bilirubin: 0.8 mg/dL (ref 0.0–1.2)
Total Protein: 8.1 g/dL (ref 6.5–8.1)

## 2023-09-17 LAB — CBC
HCT: 45.6 % (ref 36.0–46.0)
Hemoglobin: 14.7 g/dL (ref 12.0–15.0)
MCH: 29.7 pg (ref 26.0–34.0)
MCHC: 32.2 g/dL (ref 30.0–36.0)
MCV: 92.1 fL (ref 80.0–100.0)
Platelets: 255 10*3/uL (ref 150–400)
RBC: 4.95 MIL/uL (ref 3.87–5.11)
RDW: 13.1 % (ref 11.5–15.5)
WBC: 8.6 10*3/uL (ref 4.0–10.5)
nRBC: 0 % (ref 0.0–0.2)

## 2023-09-17 LAB — LIPASE, BLOOD: Lipase: 35 U/L (ref 11–51)

## 2023-09-17 MED ORDER — MECLIZINE HCL 25 MG PO TABS
25.0000 mg | ORAL_TABLET | Freq: Once | ORAL | Status: AC
Start: 2023-09-17 — End: 2023-09-17
  Administered 2023-09-17: 25 mg via ORAL
  Filled 2023-09-17: qty 1

## 2023-09-17 MED ORDER — SODIUM CHLORIDE 0.9 % IV BOLUS
1000.0000 mL | Freq: Once | INTRAVENOUS | Status: AC
  Administered 2023-09-17: 1000 mL via INTRAVENOUS

## 2023-09-17 MED ORDER — ACETAMINOPHEN 325 MG PO TABS
650.0000 mg | ORAL_TABLET | Freq: Once | ORAL | Status: AC
  Administered 2023-09-17: 650 mg via ORAL
  Filled 2023-09-17: qty 2

## 2023-09-17 MED ORDER — ONDANSETRON HCL 4 MG/2ML IJ SOLN
4.0000 mg | Freq: Once | INTRAMUSCULAR | Status: AC
  Administered 2023-09-17: 4 mg via INTRAVENOUS
  Filled 2023-09-17: qty 2

## 2023-09-17 MED ORDER — MECLIZINE HCL 25 MG PO TABS
50.0000 mg | ORAL_TABLET | Freq: Once | ORAL | Status: AC
  Administered 2023-09-17: 50 mg via ORAL
  Filled 2023-09-17: qty 2

## 2023-09-17 MED ORDER — MECLIZINE HCL 25 MG PO TABS: 25.0000 mg | ORAL_TABLET | Freq: Three times a day (TID) | ORAL | 0 refills | Status: AC | PRN

## 2023-09-17 MED ORDER — KETOROLAC TROMETHAMINE 15 MG/ML IJ SOLN
15.0000 mg | Freq: Once | INTRAMUSCULAR | Status: AC
  Administered 2023-09-17: 15 mg via INTRAVENOUS
  Filled 2023-09-17: qty 1

## 2023-09-17 MED ORDER — ONDANSETRON HCL 4 MG PO TABS: 4.0000 mg | ORAL_TABLET | Freq: Four times a day (QID) | ORAL | 0 refills | Status: AC | PRN

## 2023-09-17 NOTE — ED Provider Notes (Signed)
 [facility] Provider Note    Event Date/Time   First MD Initiated Contact with Patient 09/17/23 1442     (approximate)  History   Chief Complaint: Dizziness  HPI  Denise Simpson is a 63 y.o. female with a past medical history of anxiety, diabetes, hypertension, presents to the emergency department for dizziness nausea and vomiting.  According to the patient for the last 3 days she has been very dizzy with frequent episodes of nausea and vomiting.  Patient describes the dizziness as a spinning sensation as if the room is spinning around her.  Patient states it is somewhat worse with movements of her head but for the last 24 hours it has been nearly constant.  Patient states she has not been able to eat or drink anything today due to nausea and vomiting.  Denies any diarrhea denies any abdominal pain or chest pain.  No fever cough or congestion.  Patient states 1 prior episode approximately 3 months ago that was somewhat similar but much shorter lived, she was in [place] at the time and did not have any medical workup for this.  Patient denies any weakness or numbness of any arm or leg confusion or slurred speech.  Physical Exam   Triage Vital Signs: ED Triage Vitals  Encounter Vitals Group     BP      Systolic BP Percentile      Diastolic BP Percentile      Pulse      Resp      Temp      Temp src      SpO2      Weight      Height      Head Circumference      Peak Flow      Pain Score      Pain Loc      Pain Education      Exclude from Growth Chart     Most recent vital signs: There were no vitals filed for this visit.  General: Awake, no distress.  No noted nystagmus CV:  Good peripheral perfusion.  Regular rate and rhythm  Resp:  Normal effort.  Equal breath sounds bilaterally.  Abd:  No distention.  Soft, nontender.  No rebound or guarding.  ED Results / Procedures / Treatments   RADIOLOGY  MRI pending   MEDICATIONS ORDERED IN  ED: Medications  sodium chloride 0.9 % bolus 1,000 mL (has no administration in time range)  ondansetron (ZOFRAN) injection 4 mg (has no administration in time range)  meclizine (ANTIVERT) tablet 50 mg (has no administration in time range)     IMPRESSION / MDM / ASSESSMENT AND PLAN / ED COURSE  I reviewed the triage vital signs and the nursing notes.  Patient's presentation is most consistent with acute presentation with potential threat to life or bodily function.  Patient presents emergency department for dizziness sensation described as room spinning along with nausea and vomiting.  Symptoms are suggestive of possible vertigo versus cerebellar infarct.  Reassuringly symptoms do seem to be worse with head movements per patient however she states now over the last 24 hours they have been constant regardless of her head movements.  Patient's lab work today shows a reassuring CBC chemistry and a normal lipase.  We will obtain an MRI of the brain to further evaluate and rule out infarct, we will IV hydrate treat nausea and dose meclizine while awaiting results.  Patient agreeable to plan of care  Patient care signed out to oncoming provider MRI pending.  FINAL CLINICAL IMPRESSION(S) / ED DIAGNOSES   Dizziness  Note:  This document was prepared using Dragon voice recognition software and may include unintentional dictation errors.   Minna Antis, MD 09/17/23 [phone]

## 2023-09-17 NOTE — ED Notes (Signed)
 First Nurse Note: Pt to ED via [facility] from home for emesis x 3 days and dizziness when turning her head.   HR 89 BP 174/90 CBG 174

## 2023-09-17 NOTE — ED Provider Notes (Signed)
 Care of this patient assumed from prior physician at 1500 pending MRI and disposition. Please see prior physician note for further details.  Briefly this is a 63 year old female who presented with acute onset of dizziness described as a spinning sensation.  Labs overall reassuring.  Pending MRI at time of signout.  10:18 PM MRI resulted fortunately without evidence of stroke or other acute finding.  Patient reassessed.  She is comfortable with discharge home.  Will give her another dose of medications prior to discharge to help with symptoms tonight.  Will send prescription for Zofran and meclizine.  Strict return precautions provided.  Patient discharged stable condition.   Trinna Post, MD 09/17/23 2219

## 2023-09-17 NOTE — Discharge Instructions (Signed)
 You are seen in the ER for your dizziness.  I suspect this is related to something called vertigo.  It included more information about this in your paperwork.  I sent a prescription for nausea medication called Zofran, and a medication to help with your spitting symptoms called meclizine to your pharmacy.  Follow with your primary care doctor for further evaluation.  Return to the ER for new or worsening symptoms.

## 2023-09-17 NOTE — ED Notes (Signed)
 Report off to beth rn

## 2023-09-17 NOTE — ED Notes (Signed)
 Crackers and Ginger Ale given per pt. Request.

## 2023-09-17 NOTE — ED Notes (Signed)
 Iv started  meds given.

## 2023-09-17 NOTE — ED Triage Notes (Addendum)
 Pt is here for vertigo x 2 days.  She states that it feels like the room is spinning when she moves, especially when she moves to the left.  Pt states that this has been making her feel nauseated and she has not been able to keep po down.  No focal weakness, facial droop or changes in speech. When she is still she is not dizzy, nothing is spinning.  Pt reports that she has vomited x2 today

## 2024-04-29 ENCOUNTER — Other Ambulatory Visit: Payer: Self-pay | Admitting: Nurse Practitioner

## 2024-04-29 DIAGNOSIS — Z1231 Encounter for screening mammogram for malignant neoplasm of breast: Secondary | ICD-10-CM

## 2024-05-03 LAB — COLOGUARD

## 2024-07-05 ENCOUNTER — Ambulatory Visit
Admission: RE | Admit: 2024-07-05 | Discharge: 2024-07-05 | Disposition: A | Discharge status: Home / Self Care | Source: Ambulatory Visit | Attending: Nurse Practitioner | Admitting: Nurse Practitioner

## 2024-07-05 DIAGNOSIS — Z1231 Encounter for screening mammogram for malignant neoplasm of breast: Secondary | ICD-10-CM | POA: Diagnosis present
